# Patient Record
Sex: Female | Born: 1965 | Race: White | Hispanic: No | Marital: Married | Smoking: Current every day smoker
Health system: Southern US, Community
[De-identification: ages and names within clinical notes are randomized; demographics above are authoritative.]

---

## 1998-12-21 ENCOUNTER — Other Ambulatory Visit: Admission: RE | Admit: 1998-12-21 | Discharge: 1998-12-21 | Payer: Self-pay | Admitting: Obstetrics and Gynecology

## 1999-03-07 ENCOUNTER — Other Ambulatory Visit: Admission: RE | Admit: 1999-03-07 | Discharge: 1999-03-07 | Payer: Self-pay | Admitting: *Deleted

## 1999-09-01 ENCOUNTER — Encounter: Payer: Self-pay | Admitting: Family Medicine

## 1999-09-01 ENCOUNTER — Encounter: Admission: RE | Admit: 1999-09-01 | Discharge: 1999-09-01 | Payer: Self-pay | Admitting: Family Medicine

## 1999-11-10 ENCOUNTER — Other Ambulatory Visit: Admission: RE | Admit: 1999-11-10 | Discharge: 1999-11-10 | Payer: Self-pay | Admitting: *Deleted

## 2000-11-12 ENCOUNTER — Other Ambulatory Visit: Admission: RE | Admit: 2000-11-12 | Discharge: 2000-11-12 | Payer: Self-pay | Admitting: *Deleted

## 2001-12-17 ENCOUNTER — Other Ambulatory Visit: Admission: RE | Admit: 2001-12-17 | Discharge: 2001-12-17 | Payer: Self-pay | Admitting: Gynecology

## 2002-01-29 ENCOUNTER — Encounter (INDEPENDENT_AMBULATORY_CARE_PROVIDER_SITE_OTHER): Payer: Self-pay

## 2002-01-29 ENCOUNTER — Inpatient Hospital Stay (HOSPITAL_COMMUNITY): Admission: RE | Admit: 2002-01-29 | Discharge: 2002-01-31 | Payer: Self-pay | Admitting: Gynecology

## 2002-02-20 ENCOUNTER — Ambulatory Visit (HOSPITAL_COMMUNITY): Admission: RE | Admit: 2002-02-20 | Discharge: 2002-02-21 | Payer: Self-pay | Admitting: Surgery

## 2002-03-20 ENCOUNTER — Inpatient Hospital Stay (HOSPITAL_COMMUNITY): Admission: RE | Admit: 2002-03-20 | Discharge: 2002-03-25 | Payer: Self-pay | Admitting: Plastic Surgery

## 2003-04-14 ENCOUNTER — Other Ambulatory Visit: Admission: RE | Admit: 2003-04-14 | Discharge: 2003-04-14 | Payer: Self-pay | Admitting: Obstetrics and Gynecology

## 2004-04-26 ENCOUNTER — Other Ambulatory Visit: Admission: RE | Admit: 2004-04-26 | Discharge: 2004-04-26 | Payer: Self-pay | Admitting: Obstetrics and Gynecology

## 2005-05-16 ENCOUNTER — Other Ambulatory Visit: Admission: RE | Admit: 2005-05-16 | Discharge: 2005-05-16 | Payer: Self-pay | Admitting: Obstetrics and Gynecology

## 2006-06-06 ENCOUNTER — Other Ambulatory Visit: Admission: RE | Admit: 2006-06-06 | Discharge: 2006-06-06 | Payer: Self-pay | Admitting: Obstetrics & Gynecology

## 2007-09-17 ENCOUNTER — Other Ambulatory Visit: Admission: RE | Admit: 2007-09-17 | Discharge: 2007-09-17 | Payer: Self-pay | Admitting: Obstetrics and Gynecology

## 2008-05-05 ENCOUNTER — Encounter: Admission: RE | Admit: 2008-05-05 | Discharge: 2008-05-05 | Payer: Self-pay | Admitting: Interventional Cardiology

## 2009-09-07 ENCOUNTER — Ambulatory Visit (HOSPITAL_BASED_OUTPATIENT_CLINIC_OR_DEPARTMENT_OTHER): Admission: RE | Admit: 2009-09-07 | Discharge: 2009-09-07 | Payer: Self-pay | Admitting: Family Medicine

## 2009-09-07 ENCOUNTER — Ambulatory Visit: Payer: Self-pay | Admitting: Diagnostic Radiology

## 2009-11-22 ENCOUNTER — Ambulatory Visit (HOSPITAL_COMMUNITY): Admission: RE | Admit: 2009-11-22 | Discharge: 2009-11-22 | Payer: Self-pay | Admitting: Obstetrics and Gynecology

## 2009-12-02 ENCOUNTER — Ambulatory Visit (HOSPITAL_COMMUNITY): Admission: RE | Admit: 2009-12-02 | Discharge: 2009-12-02 | Payer: Self-pay | Admitting: Obstetrics and Gynecology

## 2010-07-30 LAB — COMPREHENSIVE METABOLIC PANEL
AST: 23 U/L (ref 0–37)
Albumin: 3.8 g/dL (ref 3.5–5.2)
BUN: 8 mg/dL (ref 6–23)
Calcium: 9.3 mg/dL (ref 8.4–10.5)
Creatinine, Ser: 0.74 mg/dL (ref 0.4–1.2)
GFR calc Af Amer: >60 mL/min (ref >60)
Total Protein: 7.1 g/dL (ref 6.0–8.3)

## 2010-07-30 LAB — CBC
MCH: 30.6 pg (ref 26.0–34.0)
MCHC: 33.8 g/dL (ref 30.0–36.0)
MCV: 90.4 fL (ref 78.0–100.0)
Platelets: 316 10*3/uL (ref 150–400)
RBC: 4.66 MIL/uL (ref 3.87–5.11)
RDW: 13.2 % (ref 11.5–15.5)

## 2010-07-30 LAB — SURGICAL PCR SCREEN
MRSA, PCR: NEGATIVE
Staphylococcus aureus: POSITIVE — AB
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
## 2010-09-29 NOTE — H&P (Signed)
NAMEMarland Kitchen  Kiara, Montes NO.:  [id number]   MEDICAL RECORD NO.:  [id number]                   PATIENT TYPE:   LOCATION:                                       [facility]ACILITY:  [facility]   PHYSICIAN:  Juan H. Lily Peer, M.D.             DATE OF BIRTH:   DATE OF ADMISSION:  01/29/2002  DATE OF DISCHARGE:                                HISTORY & PHYSICAL   HISTORY OF PRESENT ILLNESS:  The patient is a 45 year old who has had  longstanding history of menorrhagia and history of fibroid uterus.  She is  seen in the office on her annual gynecological examination  on December 17, 2001.  She has suffered tremendously for the past two years for  dysfunctional uterine bleeding.  She had an endometrial biopsy in March 15, 2002 with normal endometrium and no hyperplasia, and had several  ultrasounds which demonstrated she had three fibroids.  The last ultrasound  was done in the office, demonstrated that she had a uterus that measured  10.2 x 6.2 x 7.2 cm and multiple myomas; one measuring 5.3 x 2.7 x 5.0 cm  intramural pushing to the endometrial cavity.  A second one measured 3.9 x  3.5 x 3.0 cm intramural.  A third was 4.0 x 3.6 x 4.6 cm pedunculated versus  subserosal, which was __________  degeneration.  The right and left ovary  were normal.  There was a small amount of fluid in the cul-de-sac.  A  histogram demonstrated no other wall defects.  A recent endometrial biopsy  done at the same time demonstrated __________  endometrium, with features of  exogenous or gestational fat.  She had been on continuous oral contraceptive  pill in effort to stop her bleeding, at this time she is 45 years of age  approaching the age of 60.  She was asked to discontinue that.  In an effort  to stop her bleeding she was kept on Megace 20 mg b.i.d.  She was able to  donate two units of autologous blood.  She has also had a TSH and prolactin  as part of her  evaluation for dysfunctional  bleeding, which were normal.  Her recent Pap smear was also normal.   ALLERGIES:  She had been on oral contraceptive pill since the age of 45.  On  questioning here she demonstrated she has allergies, but will have to  question her before surgery as specifically as to what.   SOCIAL HISTORY:  She does smoke one pack of cigarettes per day.   FAMILY HISTORY:  One relative with lung cancer.   MEDICATIONS:  1. She suffers from anxiety and depression, for which she takes Effexor.  2. Alprazolam 0.5 mg q.d.  3. Motrin p.r.n.   PAST SURGICAL HISTORY:  Knee surgery.   PHYSICAL EXAMINATION:  VITAL SIGNS:  Weight 171 pounds, Height 5 feet 5-3/4  inches.  HEENT:  Unremarkable.  NECK:  Supple, trachea midline.  No carotid bruits and no thyromegaly.  LUNGS:  Clear to auscultation without rhonchi or wheezes.  HEART:  Regular rate and rhythm; without murmurs or gallops.  BREAST:  Done during the annual gynecological exam on December 17, 2001 -- this  was normal.  ABDOMEN:  Soft and nontender, without rebound or guarding.  GENITOURINARY:  Bartholin's and urethra scans within normal limits.  Vagina  and cervix with no lesions.  No discharge.  Uterus irregularly shaped, 18-  weeks size.  Adnexa with no palpable masses or tenderness, although limited  due to uterine size.  RECTAL:  Unremarkable.   IMPRESSION:  A 45 year old gravida 0 with dysfunctional uterine bleeding and  probably attributed to leiomyomatous uteri.  The patient is scheduled to  undergo abdominal myomectomy on the morning of  January 29, 2002 at  [facility].  Risks, benefits, pros and cons of the operation were  discussed with the patient -- to include:  infection (although she received  prophylaxis antibiotics), and also the risks of deep venous thrombosis and  pulmonary embolism; also the risks for additional hemorrhage, which may  result in need for additional blood (despite her two units of autologous  blood) and  donor blood would be needed to be utilized .  She is fully aware  of the potential risks, such as anaphylactic reaction, hepatitis and AIDS.  Also in effort to prevent deep venous thrombosis she will have pneumatic  compression stockings.  Finally, in the event of uncontrollable hemorrhage,  she is fully aware that she could potentially lose her reproductive organs  as a life saving measure by proceeding with hysterectomy to stop bleeding;  and that she would not be able to have any more children.  All of these  issues were discussed.  In the event that her ovaries are needed to be  removed, she will need to be placed on hormone replacement for the remainder  of her life.  All of these issues were discussed with the patient.  All  questions were answered and will follow accordingly.   PLAN:  The patient is scheduled for abdominal myomectomy on Thursday,  January 29, 2002 at 7:30 a.m. at [facility].  Please have  history and physical available.                                                Juan H. Lily Peer, M.D.    JHF/MEDQ  D:  01/29/2002  T:  01/29/2002  Job:  [id number]
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
## 2010-09-29 NOTE — Op Note (Signed)
Kiara Montes, Kiara Montes                       ACCOUNT NO.:  [id number]   MEDICAL RECORD NO.:  [id number]                   PATIENT TYPE:  INP   LOCATION:  NA                                   [facility]   PHYSICIAN:  Juan H. Lily Peer, M.D.             DATE OF BIRTH:  09-09-1965   DATE OF PROCEDURE:  DATE OF DISCHARGE:                                 OPERATIVE REPORT   PREOPERATIVE DIAGNOSES:  1. Symptomatic leiomyomatous uteri.  2. Menometrorrhagia.   POSTOPERATIVE DIAGNOSES:  1. Symptomatic leiomyomatous uteri.  2. Menometrorrhagia.   SURGEON:  Juan H. Lily Peer, M.D.   FIRST ASSISTANT:  Devin M. Ciliberti, M.D.   INDICATIONS FOR OPERATION:  A 45 year old gravida 0 with symptomatic  leiomyomatous uteri contributing to menorrhagia and pelvic pain.   ANESTHESIA:  General endotracheal anesthesia.   PROCEDURE PERFORMED:  1. Laparotomy.  2. Abdominal myomectomy.   FINDINGS:  Pedunculated fungating cystic leiomyoma measuring 3 x 4 cm in the  fundal region of the uterus along with multiple intramural leiomyomas of  various sizes and normal appearing tubes and ovaries.   DESCRIPTION OF OPERATION:  After the patient was adequately counseled she  was taken to the operating room where she underwent a successful general  endotracheal anesthesia.  The abdomen, vagina, and perineum were prepped and  draped in the usual sterile fashion.  Foley catheter was introduced in the  bladder in an effort to monitor urinary output intraoperatively.  After this  the drapes were in place.  The patient did receive 1 g Cefotan  prophylactically before surgery.  She had pneumatic compression stockings in  an effort to prevent DVT.  After the drapes were in place, a Pfannenstiel  skin incision was made 2 cm above the symphysis pubis.  The incision was  carried down from the skin, subcutaneous tissue, down to the rectus fascia.  A midline nick was made.  The fascia was incised in a transverse  fashion.  The midline raphe was entered.  The peritoneal cavity was entered  cautiously.  Pelvic inspection demonstrated a fungated fundal leiomyoma  which appears to be cystic degeneration from the fundal aspect of the  uterus.  Several intramural leiomyomas of various sizes were palpated  throughout the uterus.  The fallopian tubes and ovaries were normal.  Lush  fimbriated end on both ovaries.  The cul-de-sac was free of any  endometriosis or any adhesions.  Pitressin solution of 1 in 5 with normal  saline was infiltrated throughout different areas of the uterine serosa in  an effort to hopefully remove these fibroids.  The first one removed was the  pedunculated fungating cystic type leiomyoma which measured 3 x 4 cm.  It  was excised and passed off the operative field.  It was submitted for frozen  section.  Pathologist, Dr. Tamala Fothergill had called back to the operating room  suite stating that it was a  benign cystic degenerative leiomyoma.  Once this  was removed, a posterior incision was made behind the uterus whereby several  intramural leiomyomas were removed and as well as an anterior incision was  made in the lower uterine segment where several myomas were removed as well.  These defects were closed in the following fashion.  The myometrium was  closed with a running stitch of 3-0 Vicryl and the serosa was closed with an  imbricating suture of 3-0 Vicryl suture.  The pelvic cavity was then  copiously irrigated with normal saline solution.  Interceed was placed over  these surfaces in an effort to prevent further adhesions.  The needle count  and sponge count were correct.  The O'Connor-O-Sullivan retractor was  removed.  The visceroperitoneum was not reapproximated.  The rectus fascia  was closed with a running stitch of 0 Vicryl suture.  The subcutaneous  bleeders were Bovie cauterized.  The skin was reapproximated with skin clips  followed by placing Xeroform gauze and 4 x 8  dressing.  The patient was  extubated, transferred to recovery room with stable vital signs.  She was  given 30 mg of Toradol en route to the recovery room.  Blood loss from  procedure was 75 cc.  IV fluids was 1600 cc of lactated Ringer's.  Urine  output was 125 cc.                                               Juan H. Lily Peer, M.D.    JHF/MEDQ  D:  01/29/2002  T:  01/29/2002  Job:  [id number]
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
## 2012-06-12 ENCOUNTER — Ambulatory Visit (HOSPITAL_BASED_OUTPATIENT_CLINIC_OR_DEPARTMENT_OTHER)
Admission: RE | Admit: 2012-06-12 | Discharge: 2012-06-12 | Disposition: A | Discharge status: Home / Self Care | Payer: BC Managed Care – PPO | Source: Ambulatory Visit | Attending: Family Medicine | Admitting: Family Medicine

## 2012-06-12 ENCOUNTER — Other Ambulatory Visit (HOSPITAL_BASED_OUTPATIENT_CLINIC_OR_DEPARTMENT_OTHER): Payer: Self-pay | Admitting: Family Medicine

## 2012-06-12 ENCOUNTER — Encounter (HOSPITAL_BASED_OUTPATIENT_CLINIC_OR_DEPARTMENT_OTHER): Payer: Self-pay

## 2012-06-12 DIAGNOSIS — R1031 Right lower quadrant pain: Secondary | ICD-10-CM

## 2012-06-12 MED ORDER — IOHEXOL 300 MG/ML  SOLN
100.0000 mL | Freq: Once | INTRAMUSCULAR | Status: AC | PRN
  Administered 2012-06-12: 100 mL via INTRAVENOUS

## 2013-01-22 ENCOUNTER — Other Ambulatory Visit: Payer: Self-pay | Admitting: Neurology

## 2013-02-04 ENCOUNTER — Ambulatory Visit
Admission: RE | Admit: 2013-02-04 | Discharge: 2013-02-04 | Disposition: A | Discharge status: Home / Self Care | Payer: BC Managed Care – PPO | Source: Ambulatory Visit | Attending: Neurology | Admitting: Neurology

## 2014-12-14 ENCOUNTER — Ambulatory Visit (HOSPITAL_COMMUNITY)
Admission: RE | Admit: 2014-12-14 | Discharge: 2014-12-14 | Disposition: A | Discharge status: Home / Self Care | Payer: No Typology Code available for payment source | Source: Ambulatory Visit | Attending: Physical Medicine and Rehabilitation | Admitting: Physical Medicine and Rehabilitation

## 2014-12-14 ENCOUNTER — Other Ambulatory Visit (HOSPITAL_COMMUNITY): Payer: Self-pay | Admitting: Physical Medicine and Rehabilitation

## 2014-12-14 ENCOUNTER — Other Ambulatory Visit: Payer: Self-pay | Admitting: Physical Medicine and Rehabilitation

## 2014-12-14 DIAGNOSIS — M25512 Pain in left shoulder: Secondary | ICD-10-CM | POA: Diagnosis not present

## 2014-12-14 DIAGNOSIS — M5412 Radiculopathy, cervical region: Secondary | ICD-10-CM

## 2014-12-14 DIAGNOSIS — M5126 Other intervertebral disc displacement, lumbar region: Secondary | ICD-10-CM

## 2014-12-19 ENCOUNTER — Ambulatory Visit
Admission: RE | Admit: 2014-12-19 | Discharge: 2014-12-19 | Disposition: A | Discharge status: Home / Self Care | Payer: No Typology Code available for payment source | Source: Ambulatory Visit | Attending: Physical Medicine and Rehabilitation | Admitting: Physical Medicine and Rehabilitation

## 2014-12-19 DIAGNOSIS — M5412 Radiculopathy, cervical region: Secondary | ICD-10-CM

## 2015-03-10 ENCOUNTER — Other Ambulatory Visit: Payer: Self-pay | Admitting: Neurosurgery

## 2015-03-10 DIAGNOSIS — M5416 Radiculopathy, lumbar region: Secondary | ICD-10-CM

## 2015-03-14 ENCOUNTER — Other Ambulatory Visit: Payer: Self-pay

## 2015-03-14 ENCOUNTER — Ambulatory Visit
Admission: RE | Admit: 2015-03-14 | Discharge: 2015-03-14 | Disposition: A | Discharge status: Home / Self Care | Payer: No Typology Code available for payment source | Source: Ambulatory Visit | Attending: Neurosurgery | Admitting: Neurosurgery

## 2015-03-14 DIAGNOSIS — M5416 Radiculopathy, lumbar region: Secondary | ICD-10-CM

## 2015-03-14 MED ORDER — IOHEXOL 180 MG/ML  SOLN
15.0000 mL | Freq: Once | INTRAMUSCULAR | Status: DC | PRN
  Administered 2015-03-14: 15 mL via INTRATHECAL

## 2015-03-14 MED ORDER — DIAZEPAM 5 MG PO TABS
10.0000 mg | ORAL_TABLET | Freq: Once | ORAL | Status: AC
  Administered 2015-03-14: 10 mg via ORAL

## 2015-03-14 NOTE — Discharge Instructions (Addendum)
Myelogram Discharge Instructions  1. Go home and rest quietly for the next 24 hours.  It is important to lie flat for the next 24 hours.  Get up only to go to the restroom.  You may lie in the bed or on a couch on your back, your stomach, your left side or your right side.  You may have one pillow under your head.  You may have pillows between your knees while you are on your side or under your knees while you are on your back.  2. DO NOT drive today.  Recline the seat as far back as it will go, while still wearing your seat belt, on the way home.  3. You may get up to go to the bathroom as needed.  You may sit up for 10 minutes to eat.  You may resume your normal diet and medications unless otherwise indicated.  Drink lots of extra fluids today and tomorrow.  4. The incidence of headache, nausea, or vomiting is about 5% (one in 20 patients).  If you develop a headache, lie flat and drink plenty of fluids until the headache goes away.  Caffeinated beverages may be helpful.  If you develop severe nausea and vomiting or a headache that does not go away with flat bed rest, call [phone].  5. You may resume normal activities after your 24 hours of bed rest is over; however, do not exert yourself strongly or do any heavy lifting tomorrow. If when you get up you have a headache when standing, go back to bed and force fluids for another 24 hours.  6. Call your physician for a follow-up appointment.  The results of your myelogram will be sent directly to your physician by the following day.  7. If you have any questions or if complications develop after you arrive home, please call [phone].  Discharge instructions have been explained to the patient.  The patient, or the person responsible for the patient, fully understands these instructions.       May resume Citalapram and Imipramine on Nov. 1, 2016, after 9:30 am.

## 2018-07-24 DIAGNOSIS — G43909 Migraine, unspecified, not intractable, without status migrainosus: Secondary | ICD-10-CM | POA: Diagnosis not present

## 2018-07-24 DIAGNOSIS — E559 Vitamin D deficiency, unspecified: Secondary | ICD-10-CM | POA: Diagnosis not present

## 2018-07-24 DIAGNOSIS — M545 Low back pain: Secondary | ICD-10-CM | POA: Diagnosis not present

## 2018-07-24 DIAGNOSIS — F331 Major depressive disorder, recurrent, moderate: Secondary | ICD-10-CM | POA: Diagnosis not present

## 2018-07-24 DIAGNOSIS — G8929 Other chronic pain: Secondary | ICD-10-CM | POA: Diagnosis not present

## 2018-07-24 DIAGNOSIS — F419 Anxiety disorder, unspecified: Secondary | ICD-10-CM | POA: Diagnosis not present

## 2018-07-24 DIAGNOSIS — E7800 Pure hypercholesterolemia, unspecified: Secondary | ICD-10-CM | POA: Diagnosis not present

## 2018-07-30 DIAGNOSIS — Z1231 Encounter for screening mammogram for malignant neoplasm of breast: Secondary | ICD-10-CM | POA: Diagnosis not present

## 2018-08-04 DIAGNOSIS — Z1211 Encounter for screening for malignant neoplasm of colon: Secondary | ICD-10-CM | POA: Diagnosis not present

## 2018-08-04 DIAGNOSIS — Z1212 Encounter for screening for malignant neoplasm of rectum: Secondary | ICD-10-CM | POA: Diagnosis not present

## 2018-12-31 DIAGNOSIS — Z136 Encounter for screening for cardiovascular disorders: Secondary | ICD-10-CM | POA: Diagnosis not present

## 2018-12-31 DIAGNOSIS — R9431 Abnormal electrocardiogram [ECG] [EKG]: Secondary | ICD-10-CM | POA: Diagnosis not present

## 2018-12-31 DIAGNOSIS — G43909 Migraine, unspecified, not intractable, without status migrainosus: Secondary | ICD-10-CM | POA: Diagnosis not present

## 2018-12-31 DIAGNOSIS — M545 Low back pain: Secondary | ICD-10-CM | POA: Diagnosis not present

## 2018-12-31 DIAGNOSIS — F419 Anxiety disorder, unspecified: Secondary | ICD-10-CM | POA: Diagnosis not present

## 2018-12-31 DIAGNOSIS — G8929 Other chronic pain: Secondary | ICD-10-CM | POA: Diagnosis not present

## 2018-12-31 DIAGNOSIS — Z0000 Encounter for general adult medical examination without abnormal findings: Secondary | ICD-10-CM | POA: Diagnosis not present

## 2018-12-31 DIAGNOSIS — E7800 Pure hypercholesterolemia, unspecified: Secondary | ICD-10-CM | POA: Diagnosis not present

## 2021-02-15 ENCOUNTER — Telehealth: Payer: Self-pay

## 2021-02-15 NOTE — Telephone Encounter (Signed)
NOTES SCANNED TO REFERRAL 

## 2021-04-12 ENCOUNTER — Other Ambulatory Visit: Payer: Self-pay

## 2021-04-12 ENCOUNTER — Encounter (HOSPITAL_BASED_OUTPATIENT_CLINIC_OR_DEPARTMENT_OTHER): Payer: Self-pay | Admitting: Cardiovascular Disease

## 2021-04-12 ENCOUNTER — Ambulatory Visit (INDEPENDENT_AMBULATORY_CARE_PROVIDER_SITE_OTHER): Payer: No Typology Code available for payment source | Admitting: Cardiovascular Disease

## 2021-04-12 VITALS — BP 130/72 | HR 87 | Ht 66.0 in | Wt 169.6 lb

## 2021-04-12 DIAGNOSIS — F419 Anxiety disorder, unspecified: Secondary | ICD-10-CM

## 2021-04-12 DIAGNOSIS — Z01812 Encounter for preprocedural laboratory examination: Secondary | ICD-10-CM | POA: Diagnosis not present

## 2021-04-12 DIAGNOSIS — Z720 Tobacco use: Secondary | ICD-10-CM

## 2021-04-12 DIAGNOSIS — R0789 Other chest pain: Secondary | ICD-10-CM

## 2021-04-12 DIAGNOSIS — E7800 Pure hypercholesterolemia, unspecified: Secondary | ICD-10-CM

## 2021-04-12 HISTORY — DX: Pure hypercholesterolemia, unspecified: E78.00

## 2021-04-12 HISTORY — DX: Other chest pain: R07.89

## 2021-04-12 HISTORY — DX: Tobacco use: Z72.0

## 2021-04-12 HISTORY — DX: Anxiety disorder, unspecified: F41.9

## 2021-04-12 MED ORDER — METOPROLOL TARTRATE 100 MG PO TABS: ORAL_TABLET | ORAL | 0 refills | Status: DC

## 2021-04-12 NOTE — Assessment & Plan Note (Addendum)
She has been struggling with anxiety and is working with her PCP.  We will have our health coach reach out to her for stress management techniques.  Continue propranolol and Cymbalta.

## 2021-04-12 NOTE — Assessment & Plan Note (Signed)
Continue lovastatin.  We will determine lipid goal based on her coronary CT.

## 2021-04-12 NOTE — Addendum Note (Signed)
Addended by: Toivo Bordon, Annastasia Haskins M on: 04/12/2021 05:01 PM   Modules accepted: Orders  

## 2021-04-12 NOTE — Assessment & Plan Note (Addendum)
She has chest pain to the touch and shooting chest pain.   The symptoms are atypical.  She does have underlying risk factors including hyperlipidemia and tobacco abuse.  We will get a coronary CT-A.  Based on this we will also determine her LDL goal.

## 2021-04-12 NOTE — Patient Instructions (Addendum)
Medication Instructions:  TAKE METOPROLOL 1 TABLET 2 HOURS PRIOR TO CARDIAC CT  DO NOT TAKE YOUR PROPRANOLOL THAT DAY   *If you need a refill on your cardiac medications before your next appointment, please call your pharmacy*  Lab Work: BMET 1 WEEK PRIOR TO CT   If you have labs (blood work) drawn today and your tests are completely normal, you will receive your results only by: MyChart Message (if you have MyChart) OR A paper copy in the mail If you have any lab test that is abnormal or we need to change your treatment, we will call you to review the results.  Testing/Procedures: Your physician has requested that you have cardiac CT. Cardiac computed tomography (CT) is a painless test that uses an x-ray machine to take clear, detailed pictures of your heart. For further information please visit [email or web address] Please follow instruction sheet as given.  Follow-Up: At [facility], you and your health needs are our priority.  As part of our continuing mission to provide you with exceptional heart care, we have created designated Provider Care Teams.  These Care Teams include your primary Cardiologist (physician) and Advanced Practice Providers (APPs -  Physician Assistants and Nurse Practitioners) who all work together to provide you with the care you need, when you need it.  We recommend signing up for the patient portal called "MyChart".  Sign up information is provided on this After Visit Summary.  MyChart is used to connect with patients for Virtual Visits (Telemedicine).  Patients are able to view lab/test results, encounter notes, upcoming appointments, etc.  Non-urgent messages can be sent to your provider as well.   To learn more about what you can do with MyChart, go to [email or web address].    Your next appointment:   3 month(s)  The format for your next appointment:   In Person  Provider:   Chilton Si, MD   Other Instructions   Your cardiac CT will be  scheduled at one of the below locations:   [facility] [address] [place], [place] [address] [phone]  OR  [facility] [address] [place], [place] [address] [phone]  If scheduled at [facility], please arrive at the [facility] main entrance (entrance A) of [facility] 30 minutes prior to test start time. You can use the FREE valet parking offered at the main entrance (encouraged to control the heart rate for the test) Proceed to the [facility] Radiology Department (first floor) to check-in and test prep.  If scheduled at [facility], please arrive 15 mins early for check-in and test prep.  Please follow these instructions carefully (unless otherwise directed):  Hold all erectile dysfunction medications at least 3 days (72 hrs) prior to test.  On the Night Before the Test: Be sure to Drink plenty of water. Do not consume any caffeinated/decaffeinated beverages or chocolate 12 hours prior to your test. Do not take any antihistamines 12 hours prior to your test. If the patient has contrast allergy: Patient will need a prescription for Prednisone and very clear instructions (as follows): Prednisone 50 mg - take 13 hours prior to test Take another Prednisone 50 mg 7 hours prior to test Take another Prednisone 50 mg 1 hour prior to test Take Benadryl 50 mg 1 hour prior to test Patient must complete all four doses of above prophylactic medications. Patient will need a ride after test due to Benadryl.  On  the Day of the Test: Drink plenty of water until 1 hour prior to the test. Do not eat any food 4 hours prior to the test. You may take your regular medications prior to the test.  Take metoprolol (Lopressor) two hours prior to test. HOLD Furosemide/Hydrochlorothiazide morning of the test. FEMALES- please wear underwire-free bra if available, avoid dresses & tight  clothing   *For Clinical Staff only. Please instruct patient the following:* Heart Rate Medication Recommendations for Cardiac CT  Resting HR < 50 bpm  No medication  Resting HR 50-60 bpm and BP >110/50 mmHG   Consider Metoprolol tartrate 25 mg PO 90-120 min prior to scan  Resting HR 60-65 bpm and BP >110/50 mmHG  Metoprolol tartrate 50 mg PO 90-120 minutes prior to scan   Resting HR > 65 bpm and BP >110/50 mmHG  Metoprolol tartrate 100 mg PO 90-120 minutes prior to scan  Consider Ivabradine 10-15 mg PO or a calcium channel blocker for resting HR >60 bpm and contraindication to metoprolol tartrate  Consider Ivabradine 10-15 mg PO in combination with metoprolol tartrate for HR >80 bpm         After the Test: Drink plenty of water. After receiving IV contrast, you may experience a mild flushed feeling. This is normal. On occasion, you may experience a mild rash up to 24 hours after the test. This is not dangerous. If this occurs, you can take Benadryl 25 mg and increase your fluid intake. If you experience trouble breathing, this can be serious. If it is severe call 911 IMMEDIATELY. If it is mild, please call our office. If you take any of these medications: Glipizide/Metformin, Avandament, Glucavance, please do not take 48 hours after completing test unless otherwise instructed.  Please allow 2-4 weeks for scheduling of routine cardiac CTs. Some insurance companies require a pre-authorization which may delay scheduling of this test.   For non-scheduling related questions, please contact the cardiac imaging nurse navigator should you have any questions/concerns: Rockwell Alexandria, Cardiac Imaging Nurse Navigator Larey Brick, Cardiac Imaging Nurse Navigator  Heart and Vascular Services Direct Office Dial: [phone]   For scheduling needs, including cancellations and rescheduling, please call [place], [phone].  Cardiac CT Angiogram A cardiac CT angiogram is a procedure  to look at the heart and the area around the heart. It may be done to help find the cause of chest pains or other symptoms of heart disease. During this procedure, a substance called contrast dye is injected into the blood vessels in the area to be checked. A large X-ray machine, called a CT scanner, then takes detailed pictures of the heart and the surrounding area. The procedure is also sometimes called a coronary CT angiogram, coronary artery scanning, or CTA. A cardiac CT angiogram allows the health care provider to see how well blood is flowing to and from the heart. The health care provider will be able to see if there are any problems, such as: Blockage or narrowing of the coronary arteries in the heart. Fluid around the heart. Signs of weakness or disease in the muscles, valves, and tissues of the heart. Tell a health care provider about: Any allergies you have. This is especially important if you have had a previous allergic reaction to contrast dye. All medicines you are taking, including vitamins, herbs, eye drops, creams, and over-the-counter medicines. Any blood disorders you have. Any surgeries you have had. Any medical conditions you have. Whether you are pregnant or may be pregnant. Any  anxiety disorders, chronic pain, or other conditions you have that may increase your stress or prevent you from lying still. What are the risks? Generally, this is a safe procedure. However, problems may occur, including: Bleeding. Infection. Allergic reactions to medicines or dyes. Damage to other structures or organs. Kidney damage from the contrast dye that is used. Increased risk of cancer from radiation exposure. This risk is low. Talk with your health care provider about: The risks and benefits of testing. How you can receive the lowest dose of radiation. What happens before the procedure? Wear comfortable clothing and remove any jewelry, glasses, dentures, and hearing aids. Follow  instructions from your health care provider about eating and drinking. This may include: For 12 hours before the procedure -- avoid caffeine. This includes tea, coffee, soda, energy drinks, and diet pills. Drink plenty of water or other fluids that do not have caffeine in them. Being well hydrated can prevent complications. For 4-6 hours before the procedure -- stop eating and drinking. The contrast dye can cause nausea, but this is less likely if your stomach is empty. Ask your health care provider about changing or stopping your regular medicines. This is especially important if you are taking diabetes medicines, blood thinners, or medicines to treat problems with erections (erectile dysfunction). What happens during the procedure?  Hair on your chest may need to be removed so that small sticky patches called electrodes can be placed on your chest. These will transmit information that helps to monitor your heart during the procedure. An IV will be inserted into one of your veins. You might be given a medicine to control your heart rate during the procedure. This will help to ensure that good images are obtained. You will be asked to lie on an exam table. This table will slide in and out of the CT machine during the procedure. Contrast dye will be injected into the IV. You might feel warm, or you may get a metallic taste in your mouth. You will be given a medicine called nitroglycerin. This will relax or dilate the arteries in your heart. The table that you are lying on will move into the CT machine tunnel for the scan. The person running the machine will give you instructions while the scans are being done. You may be asked to: Keep your arms above your head. Hold your breath. Stay very still, even if the table is moving. When the scanning is complete, you will be moved out of the machine. The IV will be removed. The procedure may vary among health care providers and hospitals. What can I  expect after the procedure? After your procedure, it is common to have: A metallic taste in your mouth from the contrast dye. A feeling of warmth. A headache from the nitroglycerin. Follow these instructions at home: Take over-the-counter and prescription medicines only as told by your health care provider. If you are told, drink enough fluid to keep your urine pale yellow. This will help to flush the contrast dye out of your body. Most people can return to their normal activities right after the procedure. Ask your health care provider what activities are safe for you. It is up to you to get the results of your procedure. Ask your health care provider, or the department that is doing the procedure, when your results will be ready. Keep all follow-up visits as told by your health care provider. This is important. Contact a health care provider if: You have any symptoms  of allergy to the contrast dye. These include: Shortness of breath. Rash or hives. A racing heartbeat. Summary A cardiac CT angiogram is a procedure to look at the heart and the area around the heart. It may be done to help find the cause of chest pains or other symptoms of heart disease. During this procedure, a large X-ray machine, called a CT scanner, takes detailed pictures of the heart and the surrounding area after a contrast dye has been injected into blood vessels in the area. Ask your health care provider about changing or stopping your regular medicines before the procedure. This is especially important if you are taking diabetes medicines, blood thinners, or medicines to treat erectile dysfunction. If you are told, drink enough fluid to keep your urine pale yellow. This will help to flush the contrast dye out of your body. This information is not intended to replace advice given to you by your health care provider. Make sure you discuss any questions you have with your health care provider. Document Revised: 01/11/2021  Document Reviewed: 12/24/2018 Elsevier Patient Education  2022 [employer].

## 2021-04-12 NOTE — Progress Notes (Signed)
Cardiology Office Note:    Date:  04/12/2021   ID:  Sosha Shepherd, DOB 1965-07-24, MRN [id number]  PCP:  Barbie Banner, MD  Cardiologist:  None    Referring MD: Richmond Campbell., PA-C   No chief complaint on file.   History of Present Illness:    Kiara Montes is a 55 y.o. female with a hx of hyperlipidemia, depression, anxiety, and migraines here for evaluation of chest pain at the request of Mady Gemma, [place].   She last saw Mady Gemma, PA on 02/13/2021 and complained of chest pain above her breast with associated dizziness and sweating but no nausea. Her EKG in the office looked normal but she was referred to cardiology due to medical history and symptom persistence.  Today, she has been not been doing well. She reports L chest pain above her breast. She describes the pain as a constant soreness with occasional shooting pain. She believes her anxiety, depression, and back pain may be related to her chest pain. Her anxiety causes her to be stressed constantly. She experiences panic attacks because of her fear of dying and her concern over her mother's health. Her stress has been high lately because her cousin recently passed away suddenly due to blood cancer. She recently started taking CBD to help with the anxiety. She also smokes 0.5 ppd to cope with the stress. At the highest, she smoked 1 ppd. She has quit for 3 months through cold [place] methods. She reports shortness of breath which she relates to smoking. She tries to stay active but cannot stand or walk for long periods of time due to her back pain. She does not formally track how long she walks or exercises. However, she often paces the floor if she is not busy with other activities. Of note, she reports numbness in her hands and feet related to her sciatica. She denies any palpitations, lightheadedness, headaches, syncope, orthopnea, PND, or lower extremity edema.  Past Medical History:  Diagnosis Date   Anxiety 04/12/2021    Atypical chest pain 04/12/2021   Pure hypercholesterolemia 04/12/2021   Tobacco abuse 04/12/2021    History reviewed. No pertinent surgical history.  Current Medications: Current Meds  Medication Sig   ALPRAZolam (XANAX) 1 MG tablet Take 1-2 tablets by mouth daily.   cyclobenzaprine (FLEXERIL) 10 MG tablet Take 1 tablet by mouth daily.   DULoxetine (CYMBALTA) 60 MG capsule Take 2 capsules by mouth daily.   ergocalciferol (VITAMIN D2) 1.25 MG (50000 UT) capsule TAKE 1 CAPSULE EVERY 7 DAYS   fluticasone (FLONASE) 50 MCG/ACT nasal spray Place into the nose.   imipramine (TOFRANIL) 10 MG tablet Take 1 tablet by mouth 3 (three) times daily.   lovastatin (MEVACOR) 20 MG tablet TAKE 1 TABLET EVERY NIGHT   meloxicam (MOBIC) 15 MG tablet Take 1 tablet by mouth daily.   metoprolol tartrate (LOPRESSOR) 100 MG tablet TAKE 1 TABLET 2 HOURS PRIOR TO CT NO PROPRANOLOL THAT DAY   NON FORMULARY Take 300 mg by mouth daily. CBD Gummies reported by pt.   propranolol (INDERAL) 40 MG tablet Take one pill every day     Allergies:   Penicillins and Wellbutrin [bupropion]   Social History   Socioeconomic History   Marital status: Single    Spouse name: Not on file   Number of children: Not on file   Years of education: Not on file   Highest education level: Not on file  Occupational History   Not on file  Tobacco Use   Smoking status: Every Day    Packs/day: 0.50    Years: 25.00    Pack years: 12.50    Types: Cigarettes   Smokeless tobacco: Never  Substance and Sexual Activity   Alcohol use: Not on file   Drug use: Not on file   Sexual activity: Not on file  Other Topics Concern   Not on file  Social History Narrative   Not on file   Social Determinants of Health   Financial Resource Strain: Not on file  Food Insecurity: Not on file  Transportation Needs: Not on file  Physical Activity: Not on file  Stress: Not on file  Social Connections: Not on file     Family History: The  patient's family history is not on file.  ROS:   Please see the history of present illness.    (+) Chest pain (+) Stress/Anxiety (+) Back pain (+) Tobacco use (+) Shortness of breath (+) Peripheral numbness (bilateral hands and feet) All other systems reviewed and negative.   EKGs/Labs/Other Studies Reviewed:    The following studies were reviewed today: No prior cardiovascular studies  EKG:   04/12/21: Sinus rhythm, rate 87 bpm  Recent Labs: No results found for requested labs within last 8760 hours.   Recent Lipid Panel No results found for: CHOL, TRIG, HDL, CHOLHDL, VLDL, LDLCALC, LDLDIRECT  CHA2DS2-VASc Score =   [ ] .  Therefore, the patient's annual risk of stroke is   %.        Physical Exam:    VS:  BP 130/72   Pulse 87   Ht 5\' 6"  (1.676 m)   Wt 169 lb 9.6 oz (76.9 kg)   BMI 27.37 kg/m  , BMI Body mass index is 27.37 kg/m. GENERAL:  Well appearing HEENT: Pupils equal round and reactive, fundi not visualized, oral mucosa unremarkable NECK:  No jugular venous distention, waveform within normal limits, carotid upstroke brisk and symmetric, no bruits, no thyromegaly LUNGS:  Clear to auscultation bilaterally HEART:  RRR.  PMI not displaced or sustained,S1 and S2 within normal limits, no S3, no S4, no clicks, no rubs, no murmurs ABD:  Flat, positive bowel sounds normal in frequency in pitch, no bruits, no rebound, no guarding, no midline pulsatile mass, no hepatomegaly, no splenomegaly EXT:  2 plus pulses throughout, no edema, no cyanosis no clubbing SKIN:  No rashes no nodules NEURO:  Cranial nerves II through XII grossly intact, motor grossly intact throughout PSYCH:  Cognitively intact, oriented to person place and time   ASSESSMENT:    1. Pre-procedure lab exam   2. Atypical chest pain   3. Anxiety   4. Tobacco abuse   5. Pure hypercholesterolemia    PLAN:    Atypical chest pain She has chest pain to the touch and shooting chest pain.   The symptoms  are atypical.  She does have underlying risk factors including hyperlipidemia and tobacco abuse.  We will get a coronary CT-A.  Based on this we will also determine her LDL goal.  Anxiety She has been struggling with anxiety and is working with her PCP.  We will have our health coach reach out to her for stress management techniques.  Continue propranolol and Cymbalta.  Tobacco abuse She is in the precontemplative phase.  Her health coach will reach out to her for smoking cessation advised.  Pure hypercholesterolemia Continue lovastatin.  We will determine lipid goal based on her coronary CT.  In order of  problems listed above:      Medication Adjustments/Labs and Tests Ordered: Current medicines are reviewed at length with the patient today.  Concerns regarding medicines are outlined above.  Orders Placed This Encounter  Procedures   CT CORONARY MORPH W/CTA COR W/SCORE W/CA W/CM &/OR WO/CM   Basic metabolic panel    Meds ordered this encounter  Medications   metoprolol tartrate (LOPRESSOR) 100 MG tablet    Sig: TAKE 1 TABLET 2 HOURS PRIOR TO CT NO PROPRANOLOL THAT DAY    Dispense:  1 tablet    Refill:  0    Disposition: FU with Spenser Cong C. Duke Salvia, MD, [facility] in 3-4 months  I,Mykaella Javier,acting as a scribe for Chilton Si, MD.,have documented all relevant documentation on the behalf of Chilton Si, MD,as directed by  Chilton Si, MD while in the presence of Chilton Si, MD.  I, Daltyn Degroat C. Duke Salvia, MD have reviewed all documentation for this visit.  The documentation of the exam, diagnosis, procedures, and orders on 04/12/2021 are all accurate and complete.   Signed, Chilton Si, MD  04/12/2021 1:08 PM    [facility]

## 2021-04-12 NOTE — Assessment & Plan Note (Signed)
She is in the precontemplative phase.  Her health coach will reach out to her for smoking cessation advised.

## 2021-04-13 ENCOUNTER — Telehealth: Payer: Self-pay

## 2021-04-13 DIAGNOSIS — Z0000 Encounter for general adult medical examination without abnormal findings: Secondary | ICD-10-CM

## 2021-04-13 NOTE — Telephone Encounter (Signed)
Called patient per referral from Dr. Duke Salvia regarding stress management and smoking cessation. Patient is interested in health coaching and has been scheduled for her initial health coaching session on 04/18/21 at 10:30am. Patient will be called at that time.    Arvind Mexicano Nedra Hai, [facility] Guide, Health Coach [address], [address] [place] [address] Telephone: [phone] Email: [email or web address]

## 2021-04-18 ENCOUNTER — Ambulatory Visit: Payer: No Typology Code available for payment source

## 2021-04-18 ENCOUNTER — Telehealth: Payer: Self-pay

## 2021-04-18 DIAGNOSIS — Z0000 Encounter for general adult medical examination without abnormal findings: Secondary | ICD-10-CM

## 2021-04-18 NOTE — Telephone Encounter (Signed)
Called patient to hold initial health coaching session. Patient did not answer. Left a message for patient to return call to hold session or to reschedule.    Anahlia Iseminger Nedra Hai, [facility] Guide, Health Coach [address], [address] [place] [address] Telephone: [phone] Email: [email or web address]

## 2021-04-19 LAB — BASIC METABOLIC PANEL
BUN/Creatinine Ratio: 10 (ref 9–23)
BUN: 9 mg/dL (ref 6–24)
CO2: 20 mmol/L (ref 20–29)
Calcium: 9.7 mg/dL (ref 8.7–10.2)
Chloride: 102 mmol/L (ref 96–106)
Creatinine, Ser: 0.92 mg/dL (ref 0.57–1.00)
Glucose: 98 mg/dL (ref 70–99)
Potassium: 4.3 mmol/L (ref 3.5–5.2)
Sodium: 140 mmol/L (ref 134–144)
eGFR: 74 mL/min/{1.73_m2} (ref >59)

## 2021-04-24 ENCOUNTER — Telehealth (HOSPITAL_COMMUNITY): Payer: Self-pay | Admitting: *Deleted

## 2021-04-24 NOTE — Telephone Encounter (Signed)
Attempted to call patient regarding upcoming cardiac CT appointment. °Left message on voicemail with name and callback number ° °Wrigley Winborne RN Navigator Cardiac Imaging °[facility] °[phone] Office °[phone] Cell ° °

## 2021-04-25 ENCOUNTER — Other Ambulatory Visit: Payer: Self-pay

## 2021-04-25 ENCOUNTER — Ambulatory Visit (HOSPITAL_COMMUNITY)
Admission: RE | Admit: 2021-04-25 | Discharge: 2021-04-25 | Disposition: A | Discharge status: Home / Self Care | Payer: Medicare HMO | Source: Ambulatory Visit | Attending: Cardiovascular Disease | Admitting: Cardiovascular Disease

## 2021-04-25 ENCOUNTER — Encounter (HOSPITAL_COMMUNITY): Payer: Self-pay

## 2021-04-25 DIAGNOSIS — I2510 Atherosclerotic heart disease of native coronary artery without angina pectoris: Secondary | ICD-10-CM | POA: Diagnosis not present

## 2021-04-25 DIAGNOSIS — R0789 Other chest pain: Secondary | ICD-10-CM

## 2021-04-25 DIAGNOSIS — E7800 Pure hypercholesterolemia, unspecified: Secondary | ICD-10-CM | POA: Diagnosis present

## 2021-04-25 MED ORDER — NITROGLYCERIN 0.4 MG SL SUBL
0.8000 mg | SUBLINGUAL_TABLET | Freq: Once | SUBLINGUAL | Status: AC
  Administered 2021-04-25: 0.8 mg via SUBLINGUAL

## 2021-04-25 MED ORDER — NITROGLYCERIN 0.4 MG SL SUBL
SUBLINGUAL_TABLET | SUBLINGUAL | Status: AC
  Filled 2021-04-25: qty 2

## 2021-04-25 MED ORDER — IOHEXOL 350 MG/ML SOLN
95.0000 mL | Freq: Once | INTRAVENOUS | Status: AC | PRN
  Administered 2021-04-25: 95 mL via INTRAVENOUS

## 2021-04-25 MED ORDER — METOPROLOL TARTRATE 5 MG/5ML IV SOLN
INTRAVENOUS | Status: AC
  Filled 2021-04-25: qty 5

## 2021-07-10 NOTE — Progress Notes (Signed)
Cardiology Office Note:    Date:  07/11/2021   ID:  Kiara Montes, DOB Mar 20, 1966, MRN [id number]  PCP:  Richmond Campbell., PA-C  Cardiologist:  None    Referring MD: Barbie Banner, MD   No chief complaint on file.   History of Present Illness:    Kiara Montes is a 56 y.o. female with a hx of hyperlipidemia, depression, anxiety, tobacco abuse, and migraines here for follow-up. She was initially seen 04/12/21 for evaluation of chest pain at the request of Mady Gemma, [place]. She saw Mady Gemma, PA on 02/13/2021 and complained of chest pain above her breast with associated dizziness and sweating but no nausea. Her EKG in the office looked normal but she was referred to cardiology due to medical history and symptom persistence.   At her last appointment, she continued to complain of L chest pain above her breast. Coronary CT 04/2021 revealed a calcium score of 0. Today, she is doing well. She vomited 3 times after her coronary CT. She is unsure if the bad reaction was due to the contrast or the nitro. However, she has had not recurrent episodes of nausea. Recently, she also has not had any episodes of irritation. Occasionally, her feet and hands will lose sensation. However, she relates the numbness to her back pain. She continues to smoke 0.5 ppd. Her back pain prevents her from exercising regularly. When she can, she paces the floor or walks outside. For diet, she tries to drink at least 2 bottles of water daily. She tries to eat healthy but occasionally enjoys her mother's cooking like beans and potatoes. She has been losing weight. She continues to take propranolol. She denies any palpitations, chest pain, or shortness of breath, lightheadedness, headaches, syncope, orthopnea, PND, or lower extremity edema.  Past Medical History:  Diagnosis Date   Anxiety 04/12/2021   Atypical chest pain 04/12/2021   Pure hypercholesterolemia 04/12/2021   Tobacco abuse 04/12/2021    No past surgical  history on file.  Current Medications: Current Meds  Medication Sig   ALPRAZolam (XANAX) 1 MG tablet Take 1-2 tablets by mouth daily.   cyclobenzaprine (FLEXERIL) 10 MG tablet Take 1 tablet by mouth daily.   DULoxetine (CYMBALTA) 60 MG capsule Take 1 capsule by mouth daily.   ergocalciferol (VITAMIN D2) 1.25 MG (50000 UT) capsule TAKE 1 CAPSULE EVERY 7 DAYS   fluticasone (FLONASE) 50 MCG/ACT nasal spray Place into the nose.   imipramine (TOFRANIL) 10 MG tablet Take 1 tablet by mouth 3 (three) times daily.   lovastatin (MEVACOR) 20 MG tablet TAKE 1 TABLET EVERY NIGHT   meloxicam (MOBIC) 15 MG tablet Take 1 tablet by mouth daily.   propranolol (INDERAL) 40 MG tablet Take one pill every day     Allergies:   Penicillins and Wellbutrin [bupropion]   Social History   Socioeconomic History   Marital status: Single    Spouse name: Not on file   Number of children: Not on file   Years of education: Not on file   Highest education level: Not on file  Occupational History   Not on file  Tobacco Use   Smoking status: Every Day    Packs/day: 0.50    Years: 25.00    Pack years: 12.50    Types: Cigarettes   Smokeless tobacco: Never  Substance and Sexual Activity   Alcohol use: Not on file   Drug use: Not on file   Sexual activity: Not on file  Other Topics  Concern   Not on file  Social History Narrative   Not on file   Social Determinants of Health   Financial Resource Strain: Not on file  Food Insecurity: Not on file  Transportation Needs: Not on file  Physical Activity: Not on file  Stress: Not on file  Social Connections: Not on file     Family History: The patient's family history is not on file.  ROS:   Please see the history of present illness.   (+) Tobacco use  (+) Back pain (+) Bilateral feet and hand numbness All other systems reviewed and negative.   EKGs/Labs/Other Studies Reviewed:    The following studies were reviewed today: CT Coronary Morph  04/25/21 IMPRESSION: 1. No evidence of CAD, CADRADS = 0.   2. Coronary calcium score of 0. This was 0 percentile for age and sex matched control.   3. Normal coronary origin with right dominance.   4. PFO  EKG:  EKG was not ordered today 04/12/21: Sinus rhythm, rate 87 bpm  Recent Labs: 04/18/2021: BUN 9; Creatinine, Ser 0.92; Potassium 4.3; Sodium 140   Recent Lipid Panel No results found for: CHOL, TRIG, HDL, CHOLHDL, VLDL, LDLCALC, LDLDIRECT  02/13/2021: Total cholesterol 182, triglycerides 73, HDL 65, LDL 106    Physical Exam:    VS:  BP 120/76 (BP Location: Right Arm, Patient Position: Sitting, Cuff Size: Normal)    Pulse 84    Ht 5\' 6"  (1.676 m)    Wt 166 lb 6.4 oz (75.5 kg)    SpO2 97%    BMI 26.86 kg/m  , BMI Body mass index is 26.86 kg/m. GENERAL:  Well appearing HEENT: Pupils equal round and reactive, fundi not visualized, oral mucosa unremarkable NECK:  No jugular venous distention, waveform within normal limits, carotid upstroke brisk and symmetric, no bruits, no thyromegaly LUNGS:  Clear to auscultation bilaterally HEART:  RRR.  PMI not displaced or sustained,S1 and S2 within normal limits, no S3, no S4, no clicks, no rubs, no murmurs ABD:  Flat, positive bowel sounds normal in frequency in pitch, no bruits, no rebound, no guarding, no midline pulsatile mass, no hepatomegaly, no splenomegaly EXT:  2 plus pulses throughout, no edema, no cyanosis no clubbing SKIN:  No rashes no nodules NEURO:  Cranial nerves II through XII grossly intact, motor grossly intact throughout PSYCH:  Cognitively intact, oriented to person place and time  ASSESSMENT:    1. Atypical chest pain   2. Anxiety   3. Tobacco abuse   4. Pure hypercholesterolemia     PLAN:    Atypical chest pain Her chest pain has resolved.  Coronary CTA was - 04/2021.  She had no CAD and her calcium score was 0.  Encouraged her to increase her exercise and continue working on smoking  cessation.  Anxiety Improving with Cymbalta and propranolol.  Tobacco abuse Encouraged her to keep working on smoking cessation.  She is going to think about it but is not ready to quit at this time.  Pure hypercholesterolemia Lipids well controlled on lovastatin.  Keep working on diet and exercise.   In order of problems listed above:      Medication Adjustments/Labs and Tests Ordered: Current medicines are reviewed at length with the patient today.  Concerns regarding medicines are outlined above.  No orders of the defined types were placed in this encounter.   No orders of the defined types were placed in this encounter.   Disposition: FU with Tiffany C.  Duke Salvia, MD, [facility] in 1 year  I,Mykaella Javier,acting as a scribe for Chilton Si, MD.,have documented all relevant documentation on the behalf of Chilton Si, MD,as directed by  Chilton Si, MD while in the presence of Chilton Si, MD.  I, Tiffany C. Duke Salvia, MD have reviewed all documentation for this visit.  The documentation of the exam, diagnosis, procedures, and orders on 07/11/2021 are all accurate and complete.   Signed, Chilton Si, MD  07/11/2021 8:46 AM    [facility]

## 2021-07-11 ENCOUNTER — Other Ambulatory Visit: Payer: Self-pay

## 2021-07-11 ENCOUNTER — Encounter (HOSPITAL_BASED_OUTPATIENT_CLINIC_OR_DEPARTMENT_OTHER): Payer: Self-pay | Admitting: Cardiovascular Disease

## 2021-07-11 ENCOUNTER — Ambulatory Visit (HOSPITAL_BASED_OUTPATIENT_CLINIC_OR_DEPARTMENT_OTHER): Payer: Medicare HMO | Admitting: Cardiovascular Disease

## 2021-07-11 DIAGNOSIS — R0789 Other chest pain: Secondary | ICD-10-CM | POA: Diagnosis not present

## 2021-07-11 DIAGNOSIS — F419 Anxiety disorder, unspecified: Secondary | ICD-10-CM | POA: Diagnosis not present

## 2021-07-11 DIAGNOSIS — E7800 Pure hypercholesterolemia, unspecified: Secondary | ICD-10-CM | POA: Diagnosis not present

## 2021-07-11 DIAGNOSIS — Z720 Tobacco use: Secondary | ICD-10-CM

## 2021-07-11 NOTE — Assessment & Plan Note (Signed)
Her chest pain has resolved.  Coronary CTA was - 04/2021.  She had no CAD and her calcium score was 0.  Encouraged her to increase her exercise and continue working on smoking cessation.

## 2021-07-11 NOTE — Patient Instructions (Signed)
Medication Instructions:  °Your Physician recommend you continue on your current medication as directed.   ° °*If you need a refill on your cardiac medications before your next appointment, please call your pharmacy* ° ° °Lab Work: °None ordered today  ° °Testing/Procedures: °None ordered today  ° ° °Follow-Up: °At [facility], you and your health needs are our priority.  As part of our continuing mission to provide you with exceptional heart care, we have created designated Provider Care Teams.  These Care Teams include your primary Cardiologist (physician) and Advanced Practice Providers (APPs -  Physician Assistants and Nurse Practitioners) who all work together to provide you with the care you need, when you need it. ° °We recommend signing up for the patient portal called "MyChart".  Sign up information is provided on this After Visit Summary.  MyChart is used to connect with patients for Virtual Visits (Telemedicine).  Patients are able to view lab/test results, encounter notes, upcoming appointments, etc.  Non-urgent messages can be sent to your provider as well.   °To learn more about what you can do with MyChart, go to [email or web address].   ° °Your next appointment:   °1 year(s) ° °The format for your next appointment:   °In Person ° °Provider:   °Tiffany Turton, MD{ ° °

## 2021-07-11 NOTE — Assessment & Plan Note (Signed)
Improving with Cymbalta and propranolol.

## 2021-07-11 NOTE — Assessment & Plan Note (Signed)
Encouraged her to keep working on smoking cessation.  She is going to think about it but is not ready to quit at this time.

## 2021-07-11 NOTE — Assessment & Plan Note (Signed)
Lipids well controlled on lovastatin.  Keep working on diet and exercise.

## 2021-07-11 NOTE — Progress Notes (Signed)
Cardiology Office Note:    Date:  07/11/2021   ID:  Kiara Montes, DOB 1965/12/06, MRN [id number]  PCP:  Richmond Campbell., PA-C  Cardiologist:  None    Referring MD: Barbie Banner, MD   No chief complaint on file.   History of Present Illness:    Kiara Montes is a 56 y.o. female with a hx of hyperlipidemia, depression, anxiety, tobacco abuse, and migraines here for follow-up. She was initially seen 04/12/21 for evaluation of chest pain at the request of Mady Gemma, [place]. She saw Mady Gemma, PA on 02/13/2021 and complained of chest pain above her breast with associated dizziness and sweating but no nausea. Her EKG in the office looked normal but she was referred to cardiology due to medical history and symptom persistence.   At her last appointment, she continued to complain of L chest pain above her breast. Coronary CT 04/2021 revealed a calcium score of 0. Today, she is doing well. She vomited 3 times after her coronary CT. She is unsure if the bad reaction was due to the contrast or the nitro. However, she has had not recurrent episodes of nausea. Recently, she also has not had any episodes of irritation. Occasionally, her feet and hands will lose sensation. However, she relates the numbness to her back pain. She continues to smoke 0.5 ppd. Her back pain prevents her from exercising regularly. When she can, she paces the floor or walks outside. For diet, she tries to drink at least 2 bottles of water daily. She tries to eat healthy but occasionally enjoys her mother's cooking like beans and potatoes. She has been losing weight. She continues to take propranolol. She denies any palpitations, chest pain, or shortness of breath, lightheadedness, headaches, syncope, orthopnea, PND, or lower extremity edema.  Past Medical History:  Diagnosis Date   Anxiety 04/12/2021   Atypical chest pain 04/12/2021   Pure hypercholesterolemia 04/12/2021   Tobacco abuse 04/12/2021    History reviewed.  No pertinent surgical history.  Current Medications: Current Meds  Medication Sig   ALPRAZolam (XANAX) 1 MG tablet Take 1-2 tablets by mouth daily.   cyclobenzaprine (FLEXERIL) 10 MG tablet Take 1 tablet by mouth daily.   DULoxetine (CYMBALTA) 60 MG capsule Take 1 capsule by mouth daily.   ergocalciferol (VITAMIN D2) 1.25 MG (50000 UT) capsule TAKE 1 CAPSULE EVERY 7 DAYS   fluticasone (FLONASE) 50 MCG/ACT nasal spray Place into the nose.   imipramine (TOFRANIL) 10 MG tablet Take 1 tablet by mouth 3 (three) times daily.   lovastatin (MEVACOR) 20 MG tablet TAKE 1 TABLET EVERY NIGHT   meloxicam (MOBIC) 15 MG tablet Take 1 tablet by mouth daily.   propranolol (INDERAL) 40 MG tablet Take one pill every day     Allergies:   Penicillins and Wellbutrin [bupropion]   Social History   Socioeconomic History   Marital status: Single    Spouse name: Not on file   Number of children: Not on file   Years of education: Not on file   Highest education level: Not on file  Occupational History   Not on file  Tobacco Use   Smoking status: Every Day    Packs/day: 0.50    Years: 25.00    Pack years: 12.50    Types: Cigarettes   Smokeless tobacco: Never  Substance and Sexual Activity   Alcohol use: Not on file   Drug use: Not on file   Sexual activity: Not on file  Other Topics  Concern   Not on file  Social History Narrative   Not on file   Social Determinants of Health   Financial Resource Strain: Not on file  Food Insecurity: Not on file  Transportation Needs: Not on file  Physical Activity: Not on file  Stress: Not on file  Social Connections: Not on file     Family History: The patient's family history is not on file.  ROS:   Please see the history of present illness.   (+) Tobacco use  (+) Back pain (+) Bilateral feet and hand numbness All other systems reviewed and negative.   EKGs/Labs/Other Studies Reviewed:    The following studies were reviewed today: CT Coronary  Morph 04/25/21 IMPRESSION: 1. No evidence of CAD, CADRADS = 0.   2. Coronary calcium score of 0. This was 0 percentile for age and sex matched control.   3. Normal coronary origin with right dominance.   4. PFO  EKG:  EKG was not ordered today 04/12/21: Sinus rhythm, rate 87 bpm  Recent Labs: 04/18/2021: BUN 9; Creatinine, Ser 0.92; Potassium 4.3; Sodium 140   Recent Lipid Panel No results found for: CHOL, TRIG, HDL, CHOLHDL, VLDL, LDLCALC, LDLDIRECT  02/13/2021: Total cholesterol 182, triglycerides 73, HDL 65, LDL 106    Physical Exam:    VS:  BP 120/76 (BP Location: Right Arm, Patient Position: Sitting, Cuff Size: Normal)    Pulse 84    Ht 5\' 6"  (1.676 m)    Wt 166 lb 6.4 oz (75.5 kg)    SpO2 97%    BMI 26.86 kg/m  , BMI Body mass index is 26.86 kg/m. GENERAL:  Well appearing HEENT: Pupils equal round and reactive, fundi not visualized, oral mucosa unremarkable NECK:  No jugular venous distention, waveform within normal limits, carotid upstroke brisk and symmetric, no bruits, no thyromegaly LUNGS:  Clear to auscultation bilaterally HEART:  RRR.  PMI not displaced or sustained,S1 and S2 within normal limits, no S3, no S4, no clicks, no rubs, no murmurs ABD:  Flat, positive bowel sounds normal in frequency in pitch, no bruits, no rebound, no guarding, no midline pulsatile mass, no hepatomegaly, no splenomegaly EXT:  2 plus pulses throughout, no edema, no cyanosis no clubbing SKIN:  No rashes no nodules NEURO:  Cranial nerves II through XII grossly intact, motor grossly intact throughout PSYCH:  Cognitively intact, oriented to person place and time  ASSESSMENT:    1. Atypical chest pain   2. Anxiety   3. Tobacco abuse   4. Pure hypercholesterolemia     PLAN:    Atypical chest pain Her chest pain has resolved.  Coronary CTA was - 04/2021.  She had no CAD and her calcium score was 0.  Encouraged her to increase her exercise and continue working on smoking  cessation.  Anxiety Improving with Cymbalta and propranolol.  Tobacco abuse Encouraged her to keep working on smoking cessation.  She is going to think about it but is not ready to quit at this time.  Pure hypercholesterolemia Lipids well controlled on lovastatin.  Keep working on diet and exercise.   In order of problems listed above:      Medication Adjustments/Labs and Tests Ordered: Current medicines are reviewed at length with the patient today.  Concerns regarding medicines are outlined above.  No orders of the defined types were placed in this encounter.   No orders of the defined types were placed in this encounter.   Disposition: FU with Chamika Cunanan C.  Duke Salvia, MD, [facility] in 1 year  I,Mykaella Javier,acting as a scribe for Chilton Si, MD.,have documented all relevant documentation on the behalf of Chilton Si, MD,as directed by  Chilton Si, MD while in the presence of Chilton Si, MD.  I, Tsutomu Barfoot C. Duke Salvia, MD have reviewed all documentation for this visit.  The documentation of the exam, diagnosis, procedures, and orders on 07/11/2021 are all accurate and complete.   Signed, Chilton Si, MD  07/11/2021 8:47 AM     [facility]

## 2023-04-13 IMAGING — CT CT HEART MORP W/ CTA COR W/ SCORE W/ CA W/CM &/OR W/O CM
4 of 7 series · 8 of 20 positions shown, 9 images · IV contrast (APPLIED)
Comparison: None.
COMPARISON: None.

Addendum:
EXAM:
OVER-READ INTERPRETATION  CT CHEST

The following report is an over-read performed by radiologist Dr.
Danay Seguin [REDACTED] on 04/25/2021. This
over-read does not include interpretation of cardiac or coronary
anatomy or pathology. The coronary CTA interpretation by the
cardiologist is attached.
HISTORY: Chest pain/anginal equiv, intermediate CAD risk, treadmill candidate
Chest pain/anginal equiv, intermediate CAD risk, not treadmill
candidate
Cardiac/Coronary CT
TECHNIQUE: The patient was scanned on a Siemens Force scanner.
PROTOCOL: A 100 kV prospective scan was triggered in the descending thoracic
aorta at 111 HU's. Axial non-contrast 3 mm slices were carried out
through the heart. The data set was analyzed on a dedicated work
station and scored using the Agatson method. Gantry rotation speed
was 250 msecs and collimation was 0.6 mm. Heart rate optimized
medically, and 0.8 mg of sublingual nitroglycerin was given. The 3D
data set was reconstructed in 5% intervals of 35-75% of the R-R
cycle. Diastolic phases were analyzed on a dedicated work station
using MPR, MIP and VRT modes. The patient received 95mL OMNIPAQUE
IOHEXOL 350 MG/ML SOLN of contrast.

[Series 6: best diast · axial · 0.39mm/px · z∈[+1199,+1242]mm · 2 of 318 slices shown, 3 images]
[im 106/318  vessel]
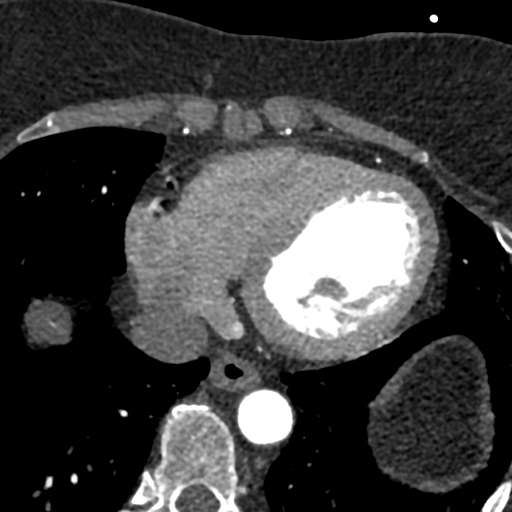
[im 106/318  lung]
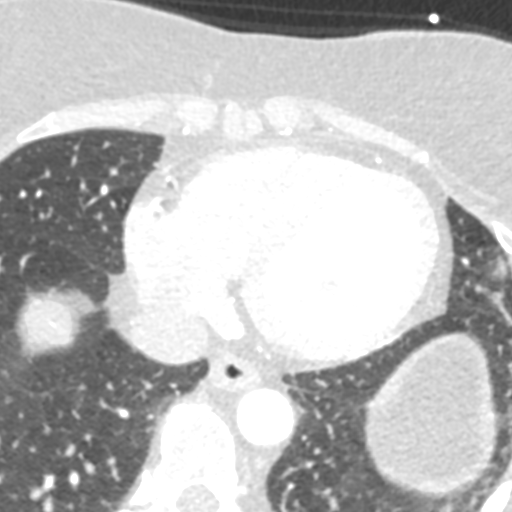
[im 212/318  vessel]
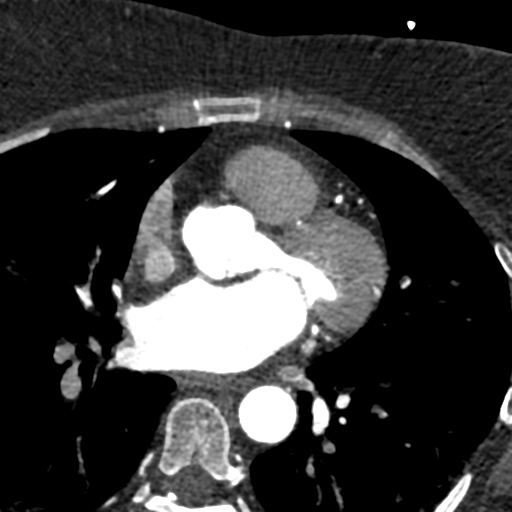

[Series 8: best syst · axial · 0.39mm/px · z∈[+1199,+1242]mm · 2 of 318 slices shown]
[im 106/318  vessel]
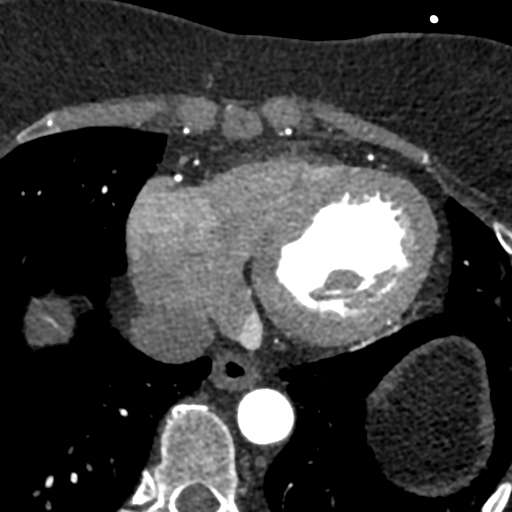
[im 212/318  vessel]
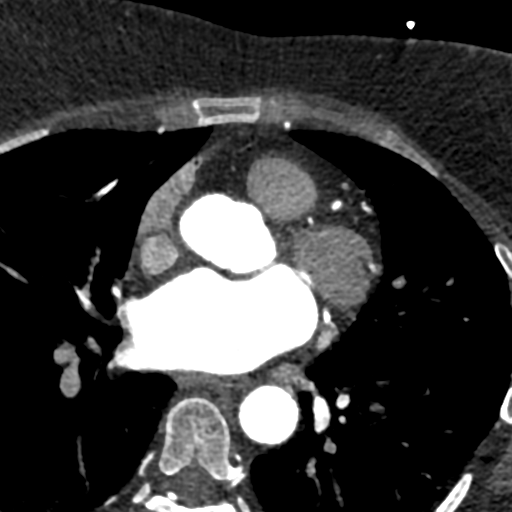

[Series 9: ts diast sharp · axial · 0.39mm/px · z∈[+1199,+1242]mm · 2 of 318 slices shown]
[im 106/318  lung]
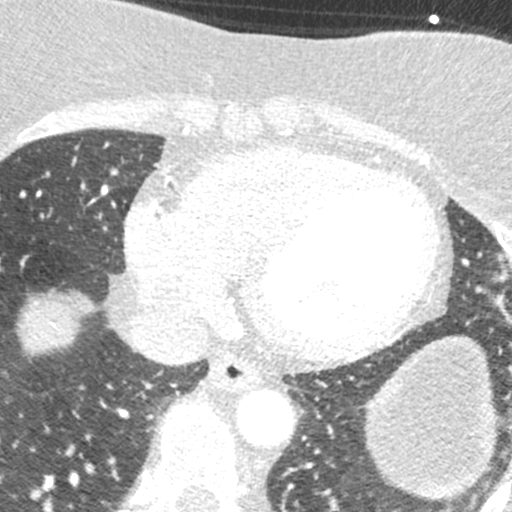
[im 212/318  lung]
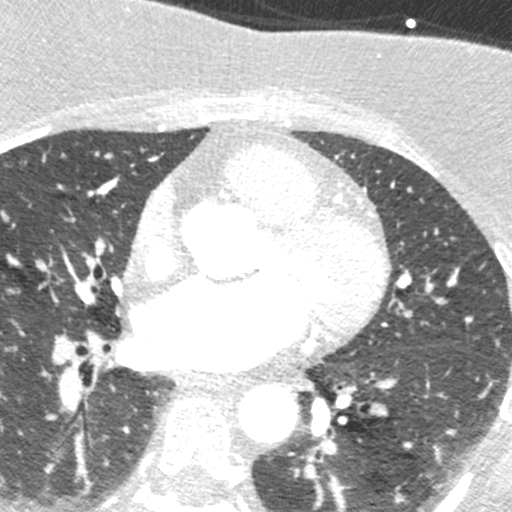

[Series 10: ts syst sharp · axial · 0.39mm/px · z∈[+1199,+1242]mm · 2 of 318 slices shown]
[im 106/318  lung]
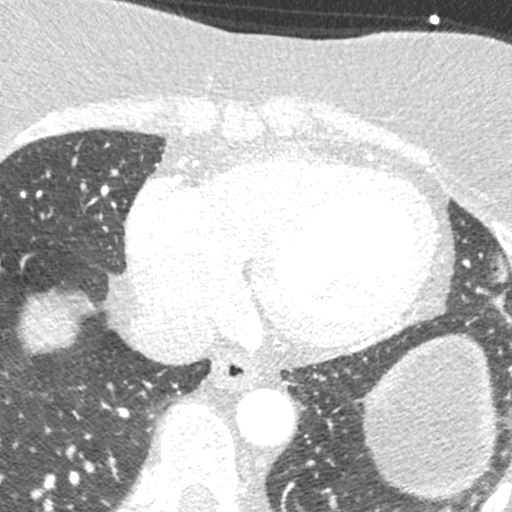
[im 212/318  lung]
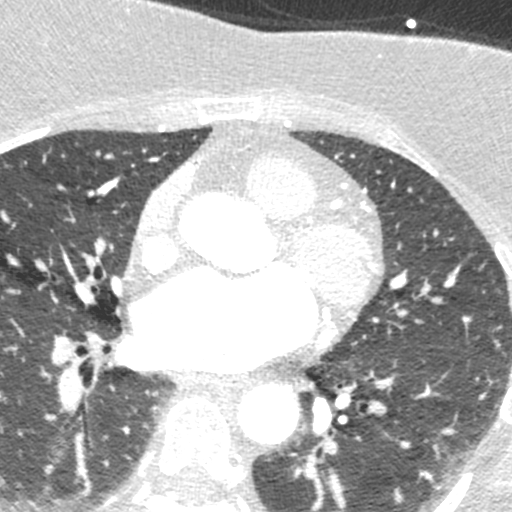

[8 of 20 positions shown; findings below may reference images not displayed]

FINDINGS: Vascular: No significant noncardiac vascular findings.

Mediastinum/Nodes: Visualized mediastinum and hilar regions
demonstrate no lymphadenopathy or masses.

Lungs/Pleura: Visualized lungs show no evidence of pulmonary edema,
consolidation, pneumothorax, nodule or pleural fluid.

Upper Abdomen: No acute abnormality.

Musculoskeletal: No chest wall mass or suspicious bone lesions
identified.
IMPRESSION: No significant incidental findings.
FINDINGS: Coronary calcium score: The patient's coronary artery calcium score
is 0, which places the patient in the 0 percentile.

Coronary arteries: Normal coronary origins.  Right dominance.

Right Coronary Artery: Normal caliber vessel, gives rise to PDA. No
significant plaque or stenosis.

Left Main Coronary Artery: Normal caliber vessel. No significant
plaque or stenosis.

Ramus intermedius: No significant plaque or stenosis.

Left Anterior Descending Coronary Artery: Normal caliber vessel. No
significant plaque or stenosis. Gives rise to 1 large and 1 small
diagonal branches.

Left Circumflex Artery: Normal caliber vessel. No significant plaque
or stenosis. Gives rise to 2 OM branches.

Aorta: Normal size, 29 mm at the mid ascending aorta (level of the
PA bifurcation) measured double oblique. No calcifications. No
dissection seen in visualized portions of the aorta.

Aortic Valve: No calcifications. Trileaflet.

Other findings:

Normal pulmonary vein drainage into the left atrium.

Normal left atrial appendage without a thrombus.

Normal size of the pulmonary artery.

Normal appearance of the pericardium.

PFO noted
IMPRESSION: 1. No evidence of CAD, CADRADS = 0.

2. Coronary calcium score of 0. This was 0 percentile for age and
sex matched control.

3. Normal coronary origin with right dominance.

4. PFO

INTERPRETATION:

1. CAD-RADS 0: No evidence of CAD (0%). Consider non-atherosclerotic
causes of chest pain.

2. CAD-RADS 1: Minimal non-obstructive CAD (0-24%). Consider
non-atherosclerotic causes of chest pain. Consider preventive
therapy and risk factor modification.

3. CAD-RADS 2: Mild non-obstructive CAD (25-49%). Consider
non-atherosclerotic causes of chest pain. Consider preventive
therapy and risk factor modification.

4. CAD-RADS 3: Moderate stenosis (50-69%). Consider symptom-guided
anti-ischemic pharmacotherapy as well as risk factor modification
per guideline directed care. Additional analysis with CT FFR will be
submitted.

5. CAD-RADS 4: Severe stenosis. (70-99% or > 50% left main). Cardiac
catheterization or CT FFR is recommended. Consider symptom-guided
anti-ischemic pharmacotherapy as well as risk factor modification
per guideline directed care. Invasive coronary angiography
recommended with revascularization per published guideline
statements.

6. CAD-RADS 5: Total coronary occlusion (100%). Consider cardiac
catheterization or viability assessment. Consider symptom-guided
anti-ischemic pharmacotherapy as well as risk factor modification
per guideline directed care.

7. CAD-RADS N: Non-diagnostic study. Obstructive CAD can't be
excluded. Alternative evaluation is recommended.

*** End of Addendum ***
EXAM:
OVER-READ INTERPRETATION  CT CHEST

The following report is an over-read performed by radiologist Dr.
Danay Seguin [REDACTED] on 04/25/2021. This
over-read does not include interpretation of cardiac or coronary
anatomy or pathology. The coronary CTA interpretation by the
cardiologist is attached.
FINDINGS: Vascular: No significant noncardiac vascular findings.

Mediastinum/Nodes: Visualized mediastinum and hilar regions
demonstrate no lymphadenopathy or masses.

Lungs/Pleura: Visualized lungs show no evidence of pulmonary edema,
consolidation, pneumothorax, nodule or pleural fluid.

Upper Abdomen: No acute abnormality.

Musculoskeletal: No chest wall mass or suspicious bone lesions
identified.
IMPRESSION: No significant incidental findings.
